# Patient Record
Sex: Female | Born: 1975 | Race: Black or African American | Hispanic: No | Marital: Single | Smoking: Never smoker
Health system: Southern US, Community
[De-identification: ages and names within clinical notes are randomized; demographics above are authoritative.]

## PROBLEM LIST (undated history)

## (undated) DIAGNOSIS — T7840XA Allergy, unspecified, initial encounter: Secondary | ICD-10-CM

## (undated) DIAGNOSIS — E785 Hyperlipidemia, unspecified: Secondary | ICD-10-CM

## (undated) DIAGNOSIS — F419 Anxiety disorder, unspecified: Secondary | ICD-10-CM

## (undated) DIAGNOSIS — I10 Essential (primary) hypertension: Secondary | ICD-10-CM

## (undated) DIAGNOSIS — D649 Anemia, unspecified: Secondary | ICD-10-CM

## (undated) HISTORY — DX: Allergy, unspecified, initial encounter: T78.40XA

## (undated) HISTORY — DX: Anemia, unspecified: D64.9

## (undated) HISTORY — DX: Hyperlipidemia, unspecified: E78.5

## (undated) HISTORY — DX: Anxiety disorder, unspecified: F41.9

## (undated) HISTORY — PX: ABDOMINAL HYSTERECTOMY: SHX81

## (undated) HISTORY — DX: Essential (primary) hypertension: I10

---

## 2009-04-11 ENCOUNTER — Emergency Department: Payer: Self-pay | Admitting: Emergency Medicine

## 2009-09-29 ENCOUNTER — Emergency Department: Payer: Self-pay | Admitting: Emergency Medicine

## 2011-01-13 ENCOUNTER — Emergency Department: Payer: Self-pay | Admitting: Emergency Medicine

## 2011-11-11 IMAGING — CR DG CHEST 1V PORT
1 series · 1 of 1 positions shown · non-contrast
Comparison: none

REASON FOR EXAM: Chest Pain
COMMENTS:

PROCEDURE:     DXR - DXR PORTABLE CHEST SINGLE VIEW  - September 29, 2009  [DATE]
RESULT:     The lung fields are clear. The heart, mediastinal and osseous
structures show no significant abnormalities.

[view not recorded]
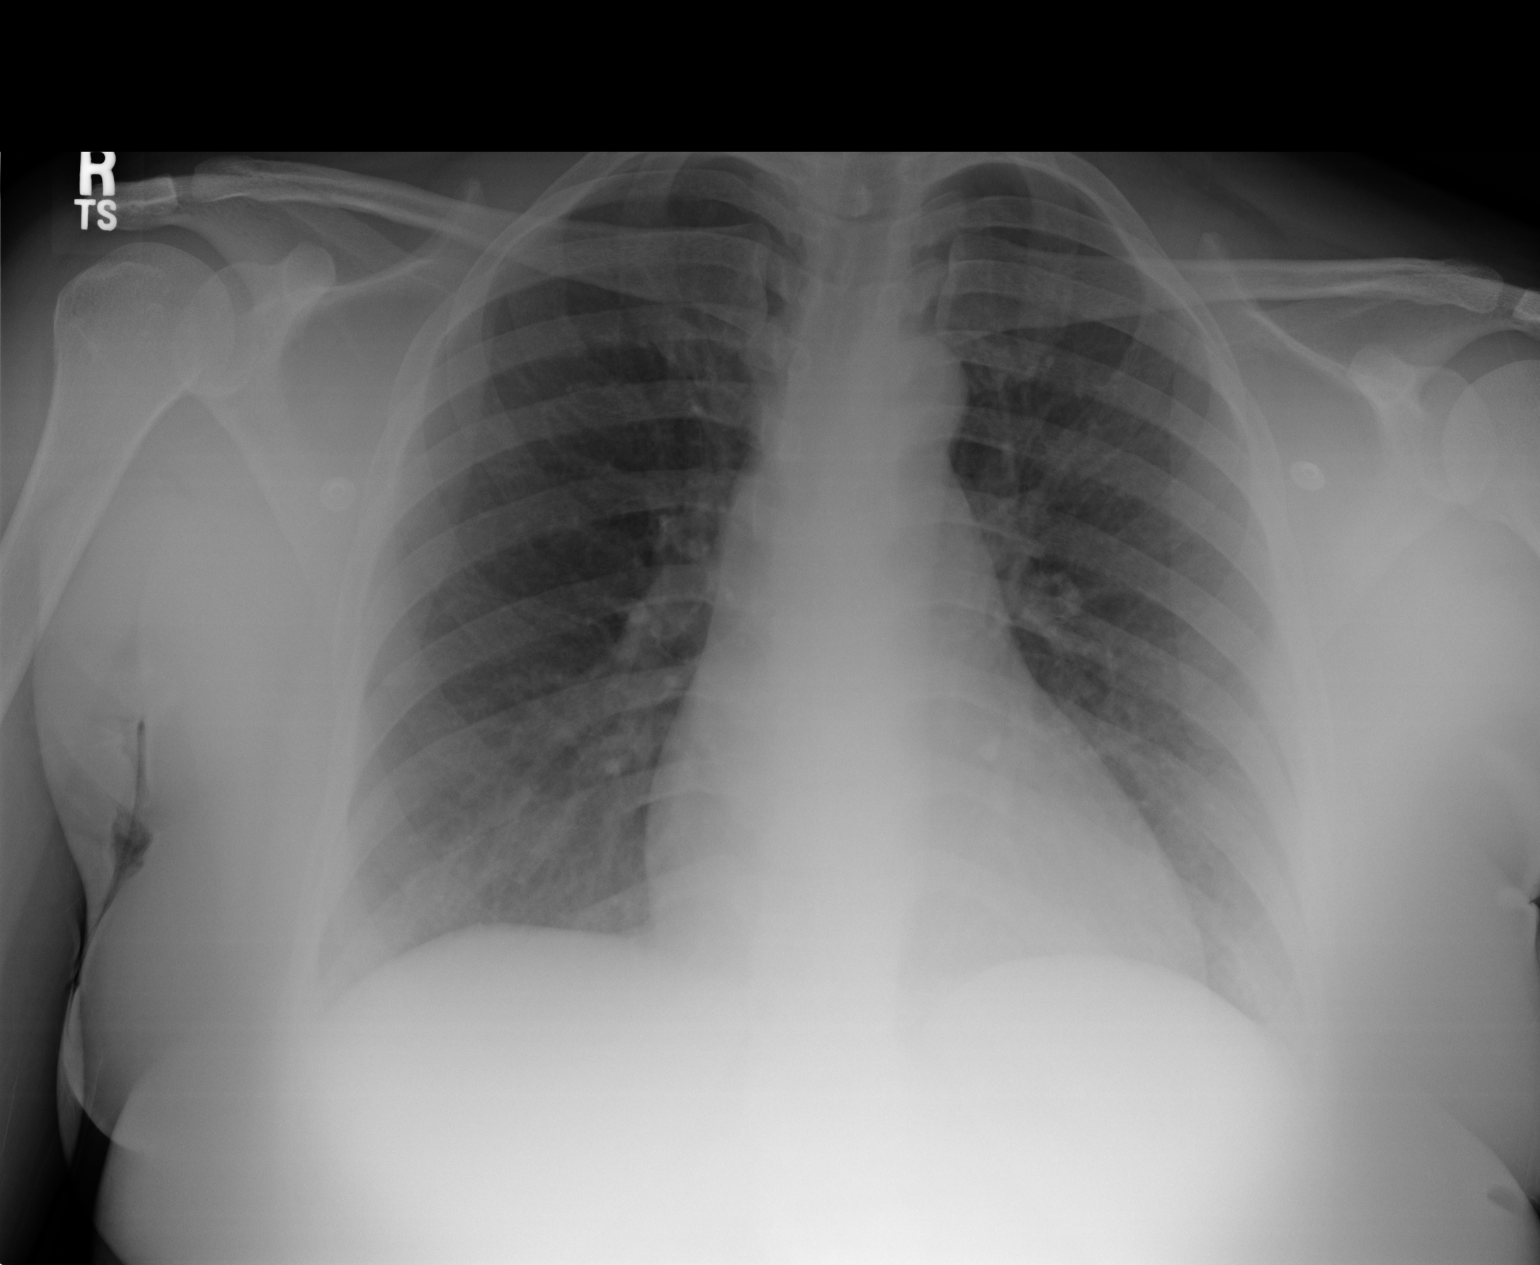

[1 of 1 positions shown; findings below may reference images not displayed]

IMPRESSION: No acute changes are identified.

## 2013-02-24 IMAGING — US ABDOMEN ULTRASOUND LIMITED
1 series · 17 of 25 positions shown · non-contrast
Comparison: none

REASON FOR EXAM: RUQ pain
COMMENTS:   Body Site: GB and Fossa, CBD, Head of Pancreas

PROCEDURE:     US  - US ABDOMEN LIMITED SURVEY  - January 13, 2011  [DATE]
RESULT:     Comparison: None.
TECHNIQUE: Multiple grayscale and color Doppler images were obtained of the
right upper quadrant.

[Series 1: abdomen ultrasound limited · 17 of 49 slices shown]
[im 1/49]
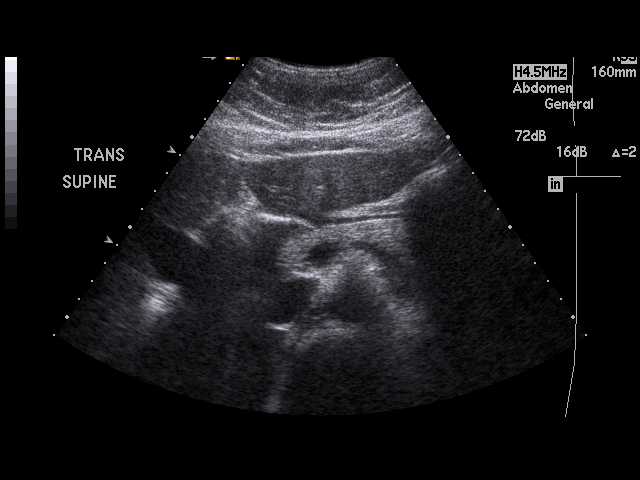
[im 5/49]
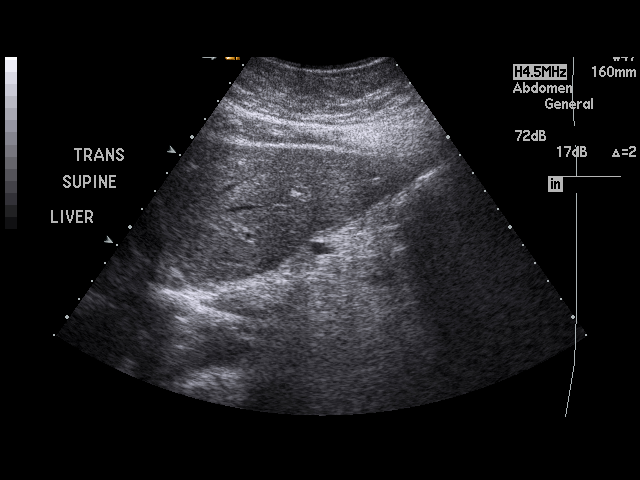
[im 7/49]
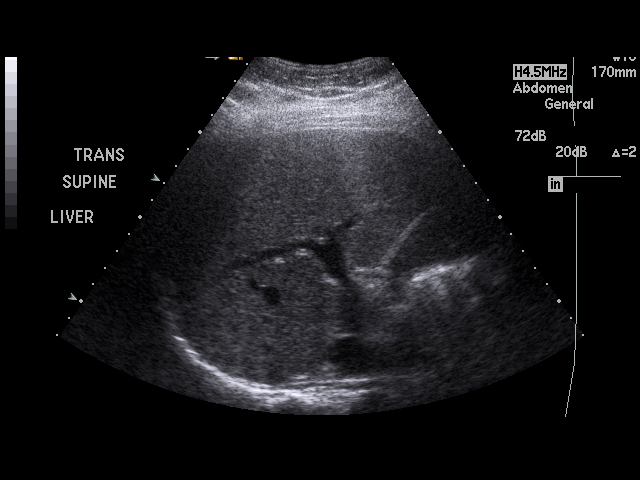
[im 11/49]
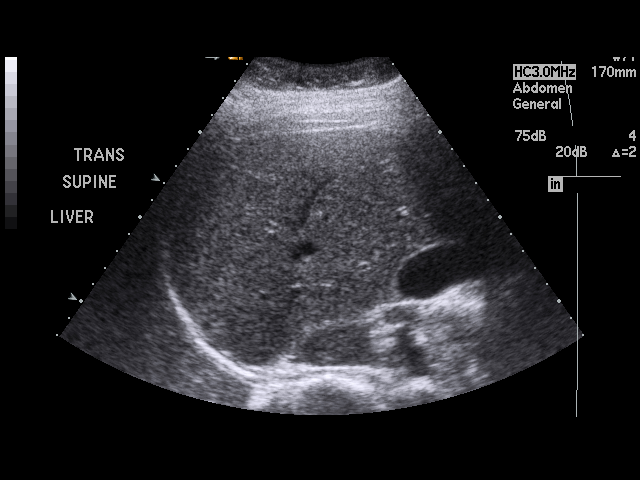
[im 13/49]
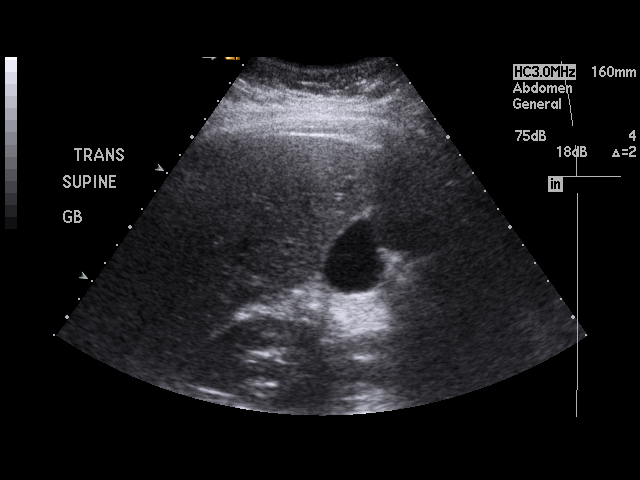
[im 17/49]
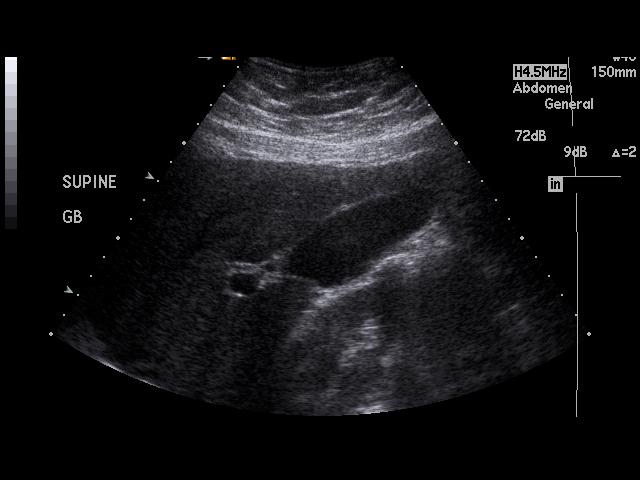
[im 19/49]
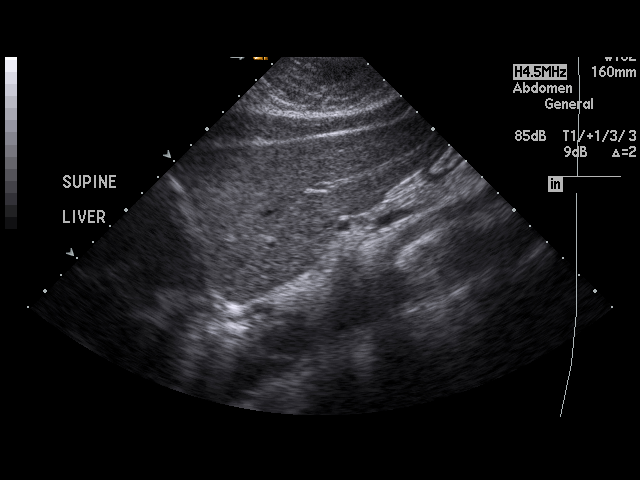
[im 23/49]
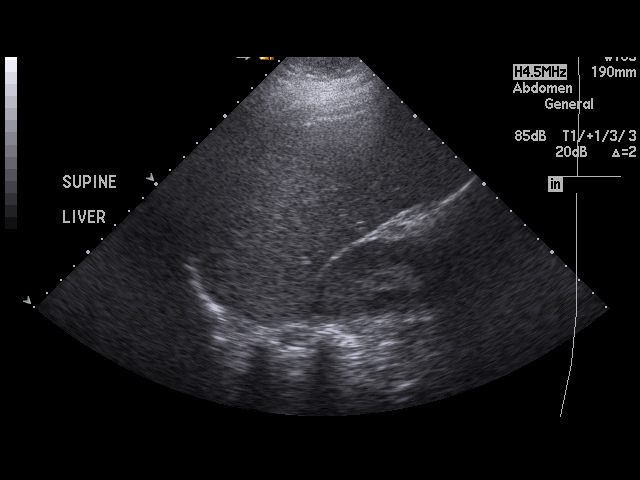
[im 25/49]
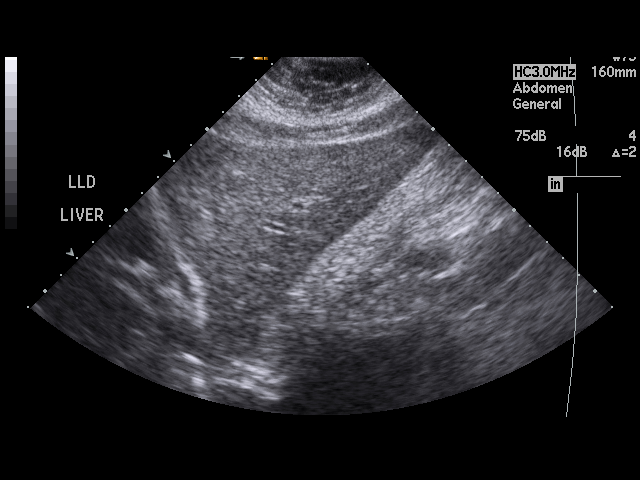
[im 27/49]
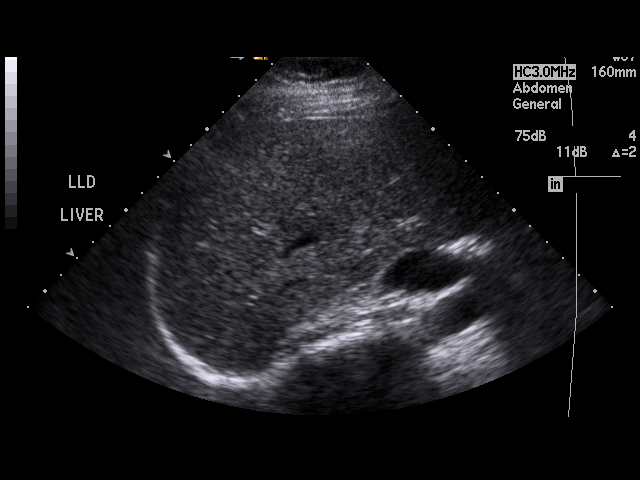
[im 31/49]
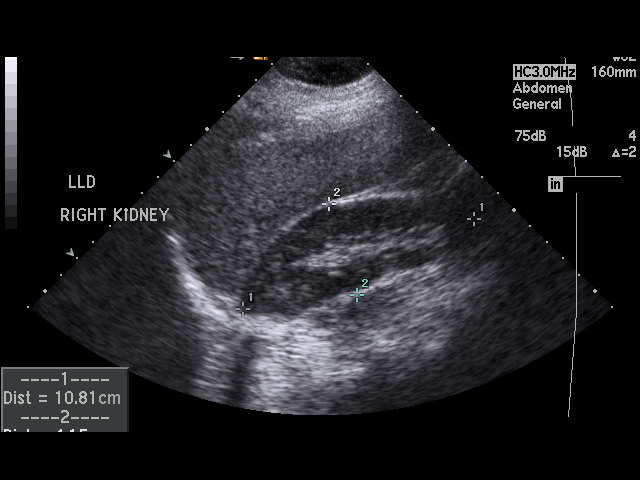
[im 33/49]
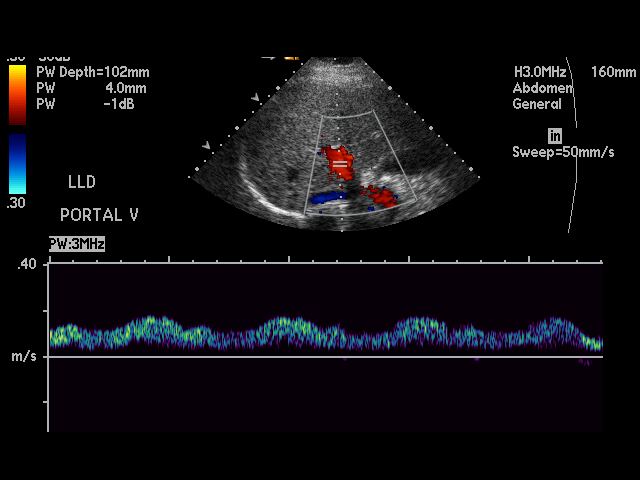
[im 37/49]
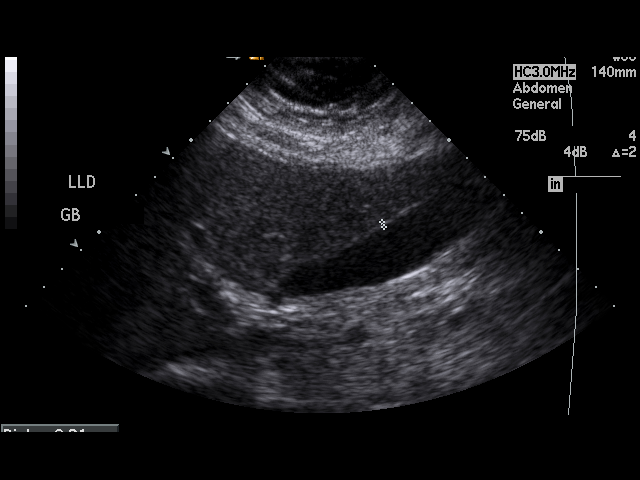
[im 39/49]
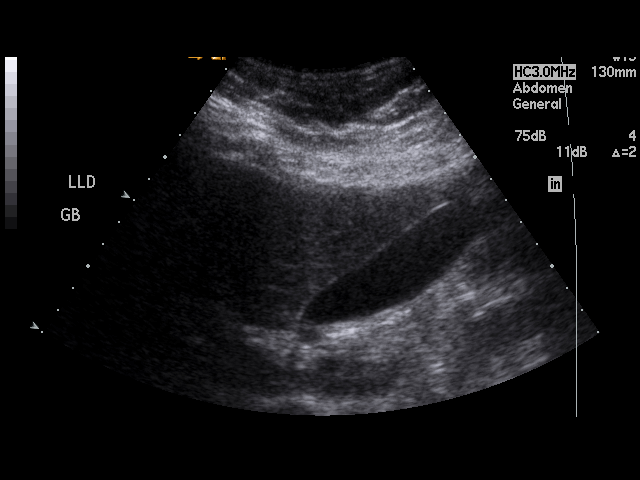
[im 43/49]
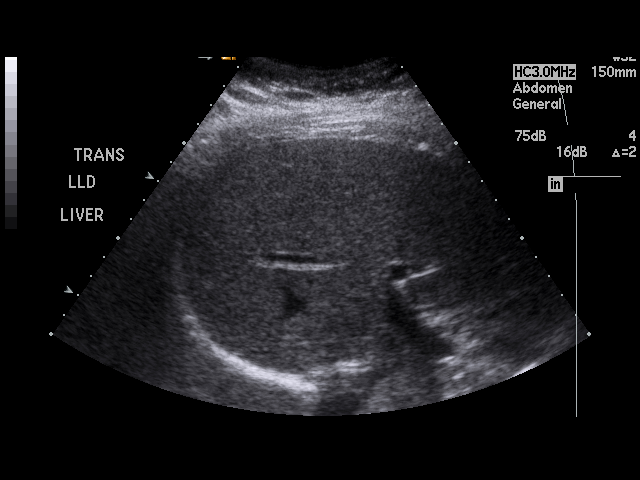
[im 45/49]
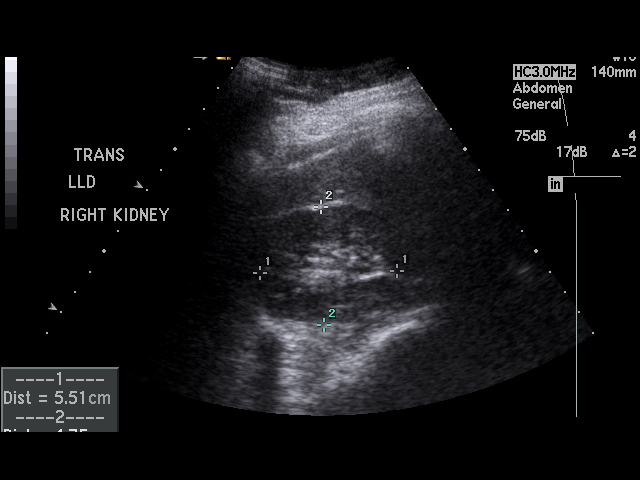
[im 49/49]
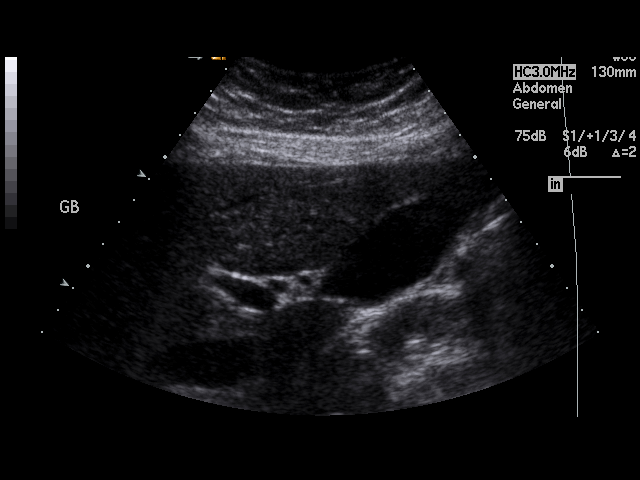

[17 of 25 positions shown; findings below may reference images not displayed]

FINDINGS: The gallbladder is normal. Sonographic Murphy sign was negative. The common
bile duct measures 3-4 mm. The visualized pancreas is unremarkable.
IMPRESSION: Normal gallbladder.

## 2013-02-24 IMAGING — CR DG CHEST 2V
1 series · 2 of 2 positions shown · non-contrast
Comparison: none

REASON FOR EXAM: cough fever
COMMENTS:

PROCEDURE:     DXR - DXR CHEST PA (OR AP) AND LATERAL  - January 13, 2011  [DATE]
RESULT:     The lungs are clear. The cardiac silhouette and visualized bony
skeleton are unremarkable.

[Series 1: w chest pa · 0.14mm/px · 2 of 2 slices shown]
[im 1/2]
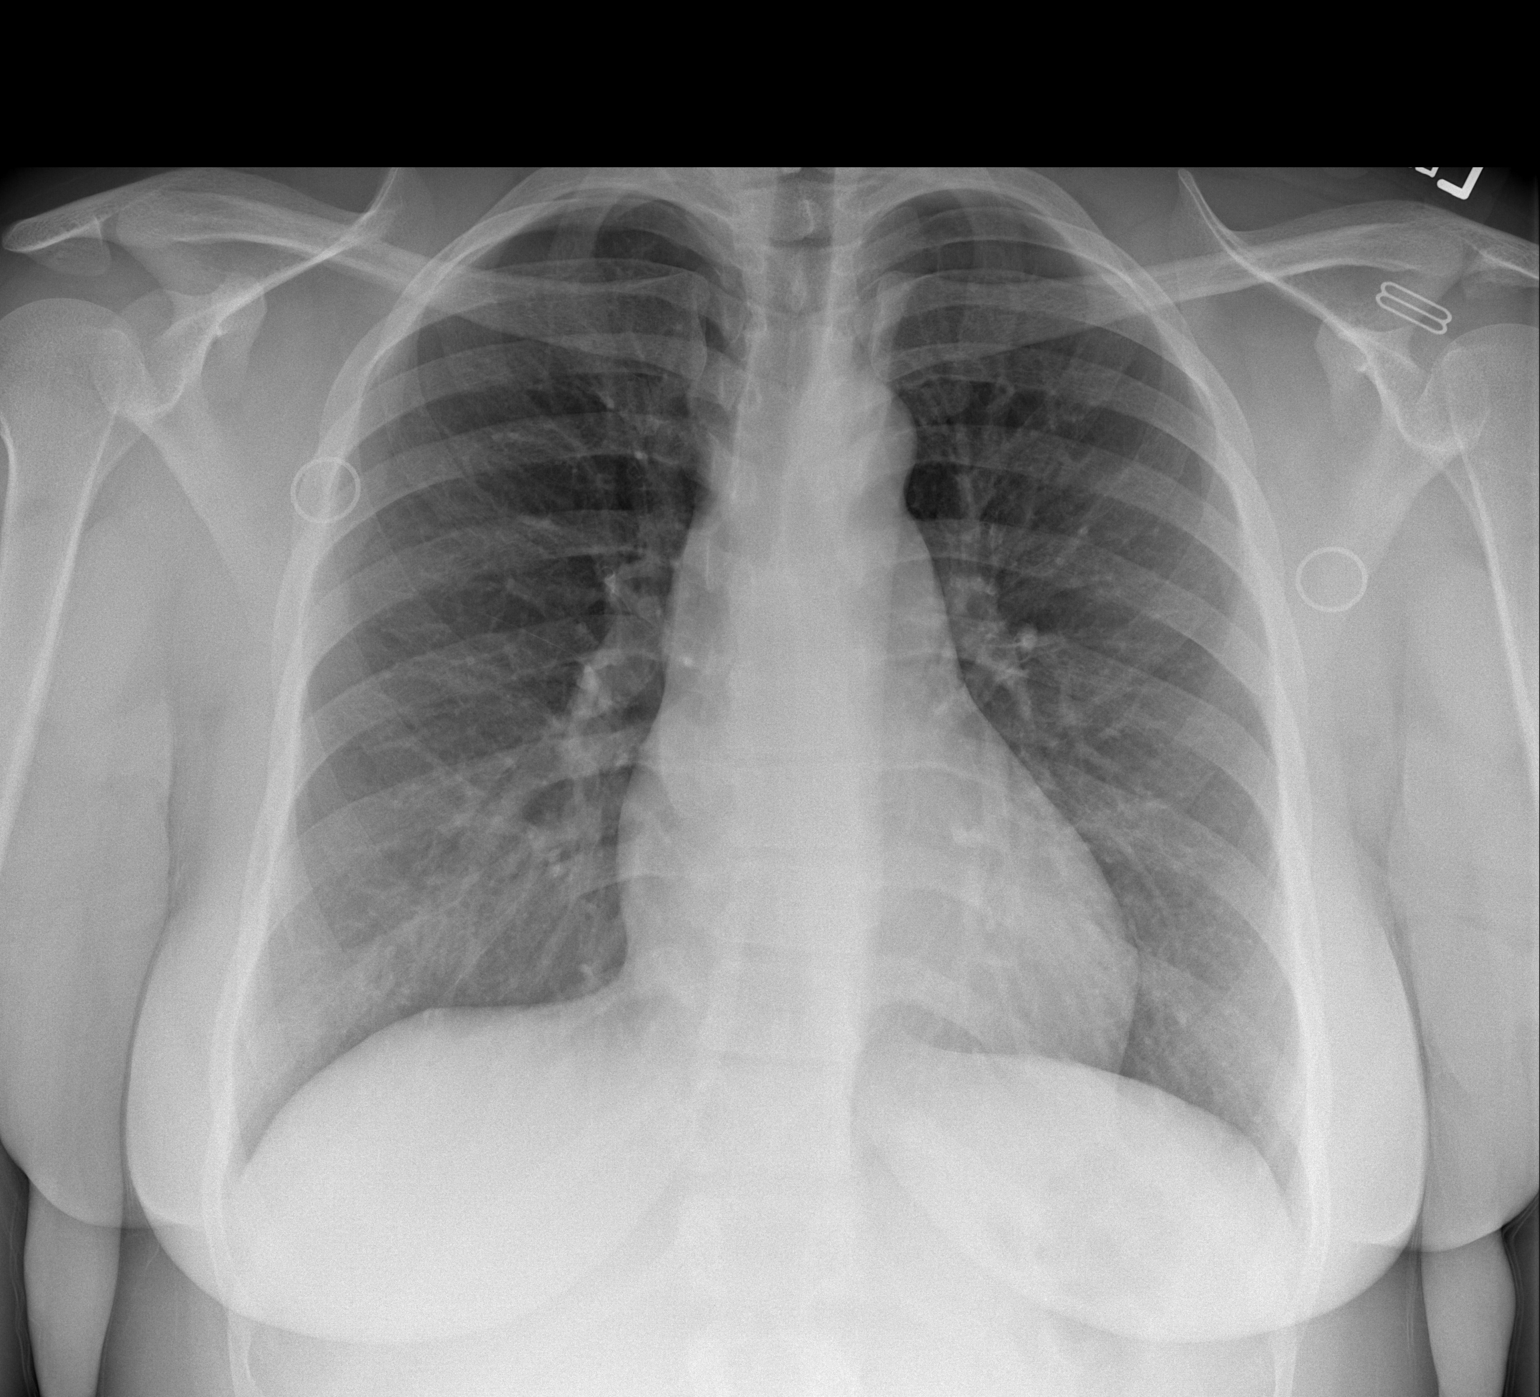
[im 2/2]
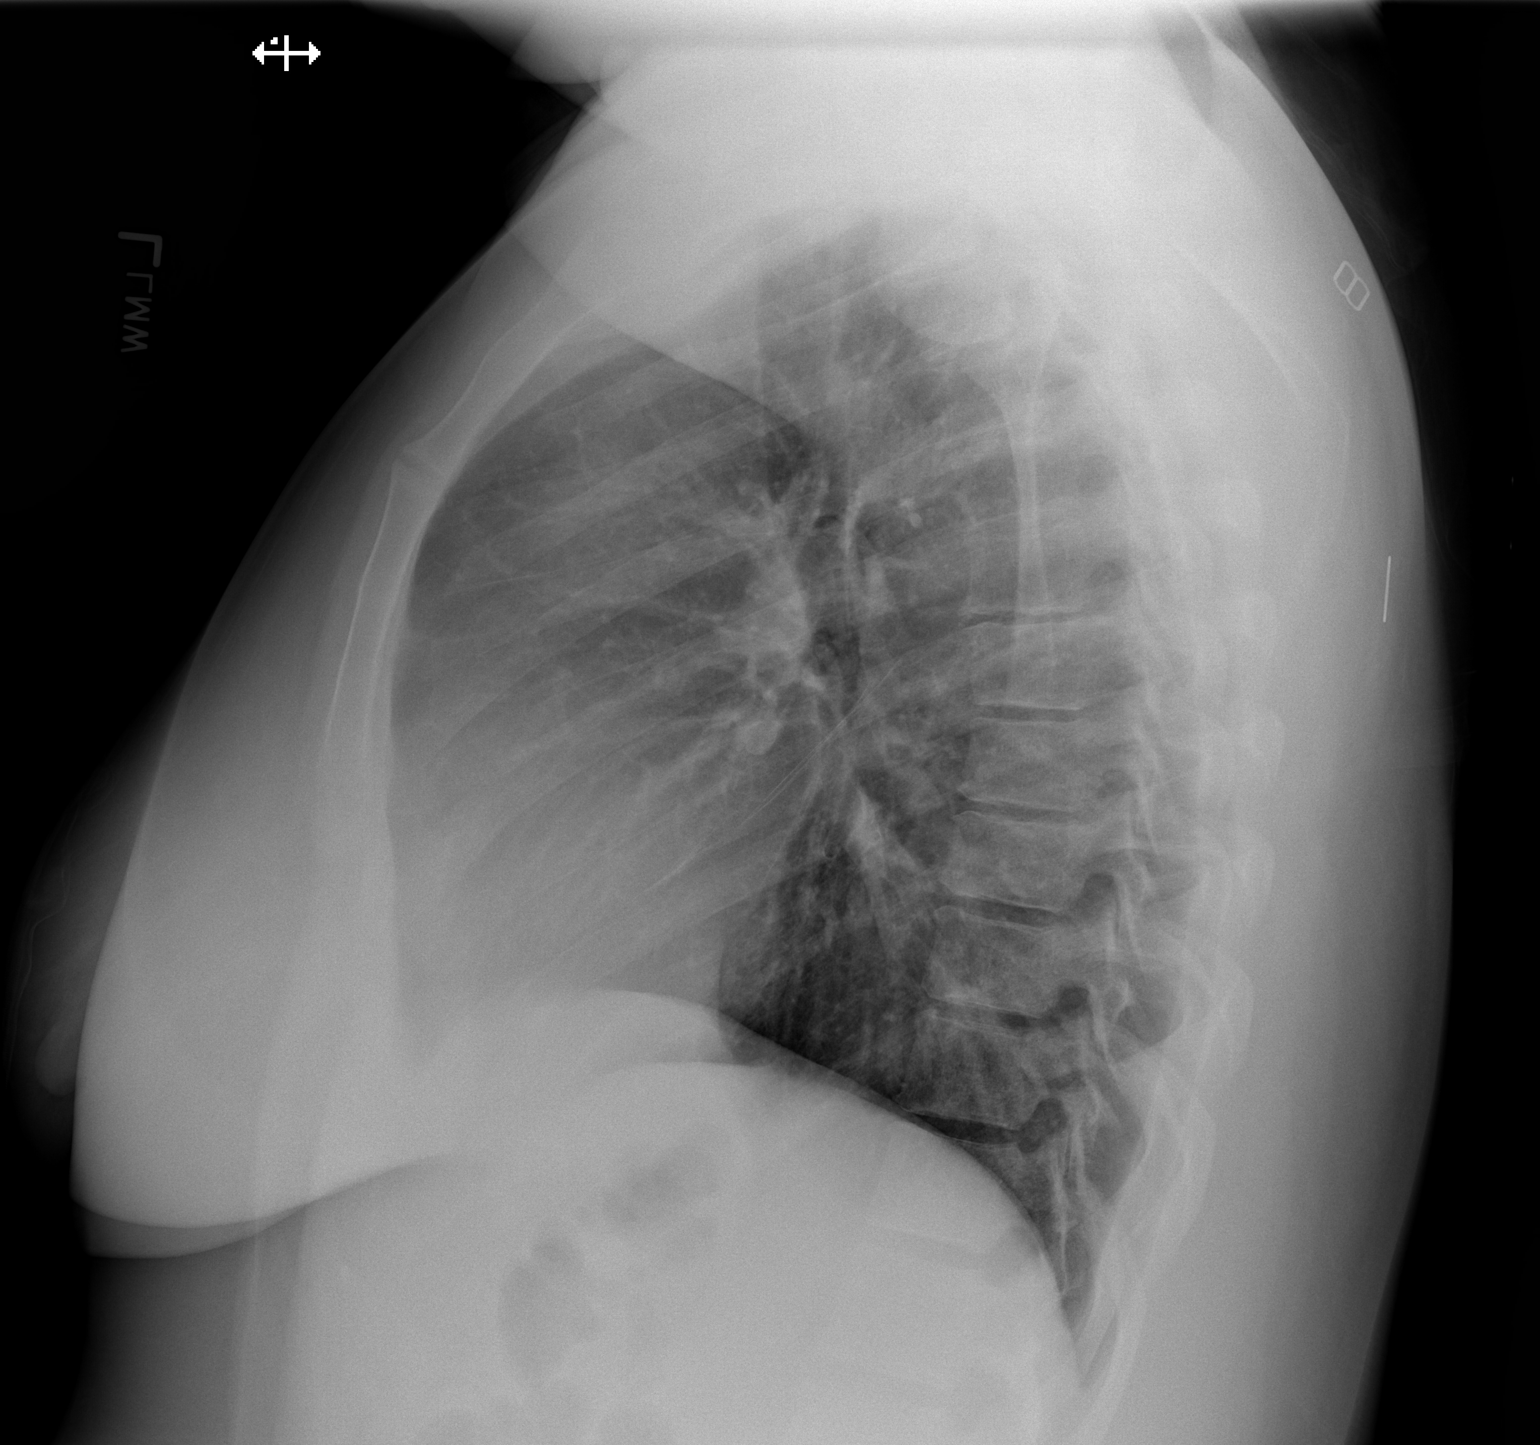

[2 of 2 positions shown; findings below may reference images not displayed]

IMPRESSION: 1. Chest radiograph without evidence of acute cardiopulmonary disease.
2. A comparison was made to prior study dated 09/30/2010.

## 2014-10-01 ENCOUNTER — Telehealth: Payer: Self-pay | Admitting: Family Medicine

## 2014-10-01 MED ORDER — ATENOLOL 50 MG PO TABS: 50.0000 mg | ORAL_TABLET | Freq: Every day | ORAL | Status: DC

## 2014-10-01 NOTE — Telephone Encounter (Signed)
Atenolol 50 mg has been refilled and sent to [facility]

## 2014-10-02 ENCOUNTER — Ambulatory Visit (INDEPENDENT_AMBULATORY_CARE_PROVIDER_SITE_OTHER): Payer: BLUE CROSS/BLUE SHIELD | Admitting: Family Medicine

## 2014-10-02 ENCOUNTER — Encounter: Payer: Self-pay | Admitting: Family Medicine

## 2014-10-02 VITALS — BP 140/78 | HR 69 | Temp 98.1°F | Resp 18 | Ht 72.0 in | Wt 285.2 lb

## 2014-10-02 DIAGNOSIS — I10 Essential (primary) hypertension: Secondary | ICD-10-CM | POA: Diagnosis not present

## 2014-10-02 DIAGNOSIS — D508 Other iron deficiency anemias: Secondary | ICD-10-CM

## 2014-10-02 DIAGNOSIS — M25511 Pain in right shoulder: Secondary | ICD-10-CM

## 2014-10-02 DIAGNOSIS — D649 Anemia, unspecified: Secondary | ICD-10-CM | POA: Insufficient documentation

## 2014-10-02 MED ORDER — VALSARTAN-HYDROCHLOROTHIAZIDE 160-12.5 MG PO TABS: 1.0000 | ORAL_TABLET | Freq: Every day | ORAL | Status: DC

## 2014-10-02 MED ORDER — ATENOLOL 50 MG PO TABS: 50.0000 mg | ORAL_TABLET | Freq: Every day | ORAL | Status: AC

## 2014-10-02 NOTE — Progress Notes (Signed)
Name: Cindy Maldonado   MRN: [id number]    DOB: 02/27/1975   Date:10/02/2014       Progress Note  Subjective  Chief Complaint  Chief Complaint  Patient presents with  . Medication Refill    Atenolol 50 mg / diovan HCT 160-12.5mg   . Hypertension  . Hyperlipidemia  . Anemia    Hypertension This is a chronic problem. The problem is unchanged. The problem is controlled. Associated symptoms include shortness of breath (at times, she has shortness of breath). Pertinent negatives include no chest pain, headaches, malaise/fatigue or palpitations. Past treatments include angiotensin blockers, diuretics and beta blockers.  Anemia Presents for follow-up visit. There has been no abdominal pain, anorexia, bruising/bleeding easily, fever, leg swelling, light-headedness, malaise/fatigue or palpitations. Signs of blood loss that are not present include hematemesis, hematochezia and menorrhagia (sometimes she passes clots during her menstrual cycle. Most times, she has regular menstrual cycle). Past treatments include nothing. There is no history of clotting disorder.  Shoulder Pain  The pain is present in the right shoulder. This is a new problem. The current episode started 1 to 4 weeks ago (3 weeks). The quality of the pain is described as aching. The pain is at a severity of 10/10. The pain is moderate. Associated symptoms include stiffness. Pertinent negatives include no fever, inability to bear weight, joint locking or numbness. The symptoms are aggravated by activity (raising the right arm). She has tried nothing for the symptoms.    Past Medical History  Diagnosis Date  . Allergy   . Anemia   . Hyperlipidemia   . Hypertension     Past Surgical History  Procedure Laterality Date  . Cesarean section      Family History  Problem Relation Age of Onset  . Hypertension Mother   . Diabetes Father   . Cancer Father     prostate  . Hypertension Sister   . Hypertension Brother     Social  History   Social History  . Marital Status: Single    Spouse Name: N/A  . Number of Children: N/A  . Years of Education: N/A   Occupational History  . Not on file.   Social History Main Topics  . Smoking status: Never Smoker   . Smokeless tobacco: Never Used  . Alcohol Use: 1.2 oz/week    2 Shots of liquor per week  . Drug Use: No  . Sexual Activity:    Partners: Male    Birth Control/ Protection: None   Other Topics Concern  . Not on file   Social History Narrative  . No narrative on file     Current outpatient prescriptions:  .  atenolol (TENORMIN) 50 MG tablet, Take 1 tablet (50 mg total) by mouth daily., Disp: 90 tablet, Rfl: 0 .  valsartan-hydrochlorothiazide (DIOVAN-HCT) 160-12.5 MG per tablet, Take 1 tablet by mouth daily., Disp: , Rfl:   No Known Allergies   Review of Systems  Constitutional: Negative for fever and malaise/fatigue.  Respiratory: Positive for shortness of breath (at times, she has shortness of breath).   Cardiovascular: Negative for chest pain and palpitations.  Gastrointestinal: Negative for abdominal pain, hematochezia, anorexia and hematemesis.  Genitourinary: Negative for menorrhagia (sometimes she passes clots during her menstrual cycle. Most times, she has regular menstrual cycle).  Musculoskeletal: Positive for stiffness.  Neurological: Negative for light-headedness, numbness and headaches.  Endo/Heme/Allergies: Does not bruise/bleed easily.   Objective  Filed Vitals:   10/02/14 0940  BP: 140/78  Pulse: 69  Temp: 98.1 F (36.7 C)  TempSrc: Oral  Resp: 18  Height: 6' (1.829 m)  Weight: 285 lb 3.2 oz (129.366 kg)  SpO2: 99%    Physical Exam  Constitutional: She is oriented to person, place, and time and well-developed, well-nourished, and in no distress.  Cardiovascular: Normal rate and regular rhythm.   Pulmonary/Chest: Effort normal and breath sounds normal.  Abdominal: Soft. Bowel sounds are normal.  Musculoskeletal:        Right shoulder: She exhibits decreased range of motion (pain when arm is extended) and pain. She exhibits no bony tenderness and no spasm.  Neurological: She is alert and oriented to person, place, and time.  Nursing note and vitals reviewed.   Assessment & Plan  1. Essential hypertension  - Comprehensive Metabolic Panel (CMET) - atenolol (TENORMIN) 50 MG tablet; Take 1 tablet (50 mg total) by mouth daily.  Dispense: 90 tablet; Refill: 1 - valsartan-hydrochlorothiazide (DIOVAN-HCT) 160-12.5 MG per tablet; Take 1 tablet by mouth daily.  Dispense: 90 tablet; Refill: 1  2. Other iron deficiency anemias  - CBC w/Diff/Platelet  3. Right shoulder pain  - DG Shoulder Right; Future   Theoplis Garciagarcia [facility] 10/02/2014 10:05 AM

## 2014-10-03 LAB — COMPREHENSIVE METABOLIC PANEL
ALK PHOS: 94 IU/L (ref 39–117)
ALT: 10 IU/L (ref 0–32)
AST: 17 IU/L (ref 0–40)
Albumin/Globulin Ratio: 1.3 (ref 1.1–2.5)
Albumin: 4.2 g/dL (ref 3.5–5.5)
BUN/Creatinine Ratio: 13 (ref 8–20)
BUN: 9 mg/dL (ref 6–20)
Bilirubin Total: 0.2 mg/dL (ref 0.0–1.2)
CO2: 25 mmol/L (ref 18–29)
CREATININE: 0.68 mg/dL (ref 0.57–1.00)
Calcium: 9.6 mg/dL (ref 8.7–10.2)
Chloride: 101 mmol/L (ref 97–108)
GFR calc Af Amer: 130 mL/min/{1.73_m2} (ref >59)
GFR calc non Af Amer: 113 mL/min/{1.73_m2} (ref >59)
GLUCOSE: 80 mg/dL (ref 65–99)
Globulin, Total: 3.3 g/dL (ref 1.5–4.5)
Potassium: 4.3 mmol/L (ref 3.5–5.2)
Sodium: 140 mmol/L (ref 134–144)
Total Protein: 7.5 g/dL (ref 6.0–8.5)

## 2014-10-03 LAB — CBC WITH DIFFERENTIAL/PLATELET
BASOS ABS: 0 10*3/uL (ref 0.0–0.2)
Basos: 0 %
EOS (ABSOLUTE): 0.2 10*3/uL (ref 0.0–0.4)
Eos: 3 %
Hematocrit: 29 % — ABNORMAL LOW (ref 34.0–46.6)
Hemoglobin: 9 g/dL — ABNORMAL LOW (ref 11.1–15.9)
IMMATURE GRANULOCYTES: 0 %
Immature Grans (Abs): 0 10*3/uL (ref 0.0–0.1)
Lymphocytes Absolute: 2 10*3/uL (ref 0.7–3.1)
Lymphs: 33 %
MCH: 22.8 pg — ABNORMAL LOW (ref 26.6–33.0)
MCHC: 31 g/dL — ABNORMAL LOW (ref 31.5–35.7)
MCV: 73 fL — ABNORMAL LOW (ref 79–97)
MONOS ABS: 0.5 10*3/uL (ref 0.1–0.9)
Monocytes: 8 %
NEUTROS PCT: 56 %
Neutrophils Absolute: 3.4 10*3/uL (ref 1.4–7.0)
Platelets: 293 10*3/uL (ref 150–379)
RBC: 3.95 x10E6/uL (ref 3.77–5.28)
RDW: 16.1 % — AB (ref 12.3–15.4)
WBC: 6.1 10*3/uL (ref 3.4–10.8)

## 2015-03-27 ENCOUNTER — Other Ambulatory Visit: Payer: Self-pay | Admitting: Family Medicine

## 2015-04-01 ENCOUNTER — Ambulatory Visit: Payer: Managed Care, Other (non HMO) | Admitting: Family Medicine

## 2015-04-14 ENCOUNTER — Ambulatory Visit: Payer: Managed Care, Other (non HMO) | Admitting: Family Medicine

## 2018-07-10 ENCOUNTER — Other Ambulatory Visit: Payer: Self-pay

## 2018-07-10 ENCOUNTER — Other Ambulatory Visit (HOSPITAL_COMMUNITY)
Admission: RE | Admit: 2018-07-10 | Discharge: 2018-07-10 | Disposition: A | Discharge status: Home / Self Care | Payer: BC Managed Care – PPO | Source: Ambulatory Visit | Attending: Obstetrics & Gynecology | Admitting: Obstetrics & Gynecology

## 2018-07-10 ENCOUNTER — Ambulatory Visit (INDEPENDENT_AMBULATORY_CARE_PROVIDER_SITE_OTHER): Payer: BC Managed Care – PPO | Admitting: Obstetrics & Gynecology

## 2018-07-10 ENCOUNTER — Encounter: Payer: Self-pay | Admitting: Obstetrics & Gynecology

## 2018-07-10 VITALS — BP 130/90 | Ht 71.0 in | Wt 320.0 lb

## 2018-07-10 DIAGNOSIS — Z124 Encounter for screening for malignant neoplasm of cervix: Secondary | ICD-10-CM | POA: Insufficient documentation

## 2018-07-10 DIAGNOSIS — Z01419 Encounter for gynecological examination (general) (routine) without abnormal findings: Secondary | ICD-10-CM

## 2018-07-10 DIAGNOSIS — Z1239 Encounter for other screening for malignant neoplasm of breast: Secondary | ICD-10-CM

## 2018-07-10 DIAGNOSIS — D219 Benign neoplasm of connective and other soft tissue, unspecified: Secondary | ICD-10-CM

## 2018-07-10 NOTE — Progress Notes (Signed)
HPI:      [name] is a 43 y.o. [phone] who LMP was Patient's last menstrual period was 07/01/2018., she presents today for her annual examination. The patient has worsening periods related to fibroids, feels they have grown, and has pressure and bladder frequency as well. Periods are monthly with heavy flow using largest pads and having accidents frequently.  Does not desire pregnancy, s/p BTL.  The patient is sexually active. Her last pap: approximate date 2017 and was normal and last mammogram: approximate date 2015 and was normal. The patient does perform self breast exams.  There is no notable family history of breast or ovarian cancer in her family.  The patient has regular exercise: yes.  The patient denies current symptoms of depression.    GYN History: Contraception: tubal ligation  PMHx: Past Medical History:  Diagnosis Date  . Allergy   . Anemia   . Anxiety   . Hyperlipidemia   . Hypertension    Past Surgical History:  Procedure Laterality Date  . CESAREAN SECTION     Family History  Problem Relation Age of Onset  . Hypertension Mother   . Diabetes Father   . Cancer Father        prostate  . Hypertension Sister   . Hypertension Brother    Social History   Tobacco Use  . Smoking status: Never Smoker  . Smokeless tobacco: Never Used  Substance Use Topics  . Alcohol use: Yes    Alcohol/week: 2.0 standard drinks    Types: 2 Shots of liquor per week  . Drug use: No    Current Outpatient Medications:  .  escitalopram (LEXAPRO) 5 MG tablet, Take by mouth., Disp: , Rfl:  .  valsartan-hydrochlorothiazide (DIOVAN-HCT) 160-12.5 MG tablet, TAKE ONE TABLET BY MOUTH ONCE DAILY, Disp: 90 tablet, Rfl: 0 .  atenolol (TENORMIN) 50 MG tablet, Take 1 tablet (50 mg total) by mouth daily., Disp: 90 tablet, Rfl: 1 Allergies: Patient has no known allergies.  Review of Systems  Constitutional: Positive for malaise/fatigue. Negative for chills and fever.  HENT:  Negative for congestion, sinus pain and sore throat.   Eyes: Negative for blurred vision and pain.  Respiratory: Negative for cough and wheezing.   Cardiovascular: Negative for chest pain and leg swelling.  Gastrointestinal: Negative for abdominal pain, constipation, diarrhea, heartburn, nausea and vomiting.  Genitourinary: Negative for dysuria, frequency, hematuria and urgency.  Musculoskeletal: Negative for back pain, joint pain, myalgias and neck pain.  Skin: Negative for itching and rash.  Neurological: Negative for dizziness, tremors and weakness.  Endo/Heme/Allergies: Does not bruise/bleed easily.  Psychiatric/Behavioral: Negative for depression. The patient is not nervous/anxious and does not have insomnia.     Objective: BP 130/90   Ht 5\' 11"  (1.803 m)   Wt (!) 320 lb (145.2 kg)   LMP 07/01/2018   BMI 44.63 kg/m   Filed Weights   07/10/18 1527  Weight: (!) 320 lb (145.2 kg)   Body mass index is 44.63 kg/m. Physical Exam Constitutional:      General: She is not in acute distress.    Appearance: She is well-developed.  Genitourinary:     Pelvic exam was performed with patient supine.     Vagina, uterus and rectum normal.     No lesions in the vagina.     No vaginal bleeding.     No cervical motion tenderness, friability, lesion or polyp.     Uterus is mobile.  Uterus is not enlarged.     No uterine mass detected.    Uterus is midaxial.     No right or left adnexal mass present.     Right adnexa not tender.     Left adnexa not tender.     Genitourinary Comments: Midline globular fibroid uterus 16 weeks size  HENT:     Head: Normocephalic and atraumatic. No laceration.     Right Ear: Hearing normal.     Left Ear: Hearing normal.     Mouth/Throat:     Pharynx: Uvula midline.  Eyes:     Pupils: Pupils are equal, round, and reactive to light.  Neck:     Musculoskeletal: Normal range of motion and neck supple.     Thyroid: No thyromegaly.  Cardiovascular:      Rate and Rhythm: Normal rate and regular rhythm.     Heart sounds: No murmur. No friction rub. No gallop.   Pulmonary:     Effort: Pulmonary effort is normal. No respiratory distress.     Breath sounds: Normal breath sounds. No wheezing.  Chest:     Breasts:        Right: No mass, skin change or tenderness.        Left: No mass, skin change or tenderness.  Abdominal:     General: Bowel sounds are normal. There is no distension.     Palpations: Abdomen is soft.     Tenderness: There is no abdominal tenderness. There is no rebound.     Comments: Umb -3 cm fibroid uterus  Musculoskeletal: Normal range of motion.  Neurological:     Mental Status: She is alert and oriented to person, place, and time.     Cranial Nerves: No cranial nerve deficit.  Skin:    General: Skin is warm and dry.  Psychiatric:        Judgment: Judgment normal.  Vitals signs reviewed.     Assessment:  ANNUAL EXAM 1. Women's annual routine gynecological examination   2. Screening for cervical cancer   3. Screening for breast cancer   4. Fibroids     Screening Plan:            1.  Cervical Screening-  Pap smear done today  2. Breast screening- Exam annually and mammogram>40 planned   3. Colonoscopy every 10 years, Hemoccult testing - after age 41  4. Labs managed by PCP  5. Counseling for contraception: bilateral tubal ligation   6. Fibroids - Fibroid treatment such as [place], Lupron, Myomectomy, and Hysterectomy discussed in detail, with the pros and cons of each choice counseled.  No treatment as an option also discussed, as well as control of symptoms alone with hormone therapy. Information provided to the patient. - Past counseling and surgery planning was for [facility], but has fallen thru in past due to insurance and job changes.  Still prefers this approach.  Fibroids are large, not amenable to laparscopy. - US PELVIC COMPLETE WITH TRANSVAGINAL; Future      F/U  Return in about 1 day (around 07/11/2018)  for Follow up w GYN [place].  Barnett Applebaum, MD, [facility], [facility] 07/10/2018  4:06 PM

## 2018-07-10 NOTE — Patient Instructions (Signed)
PAP every three years Mammogram every year    Call [phone] to schedule at [facility]    Hysterectomy Information  A hysterectomy is a surgery in which the uterus is removed. The fallopian tubes and ovaries may be removed (bilateral salpingo-oophorectomy) as well. This procedure may be done to treat various medical problems. After the procedure, a woman will no longer have menstrual periods nor will she be able to become pregnant (sterile). What are the reasons for a hysterectomy? There are many reasons why a woman might have this procedure. They include:  Persistent, abnormal vaginal bleeding.  Long-term (chronic) pelvic pain or infection.  Endometriosis. This is when the lining of the uterus (endometrium) starts to grow outside the uterus.  Adenomyosis. This is when the endometrium starts to grow in the muscle of the uterus.  Pelvic organ prolapse. This is a condition in which the uterus falls down into the vagina.  Noncancerous growths in the uterus (uterine fibroids) that cause symptoms.  The presence of precancerous cells.  Cervical or uterine cancer. What are the different types of hysterectomy? There are three different types of hysterectomy:  Supracervical hysterectomy. In this type, the top part of the uterus is removed, but not the cervix.  Total hysterectomy. In this type, the uterus and cervix are removed.  Radical hysterectomy. In this type, the uterus, the cervix, and the tissue that holds the uterus in place (parametrium) are removed. What are the different ways a hysterectomy can be performed? There are many different ways a hysterectomy can be performed, including:  Abdominal hysterectomy. In this type, an incision is made in the abdomen. The uterus is removed through this incision.  Vaginal hysterectomy. In this type, an incision is made in the vagina. The uterus is removed through this incision. There are no abdominal incisions.  Conventional  laparoscopic hysterectomy. In this type, three or four small incisions are made in the abdomen. A thin, lighted tube with a camera (laparoscope) is inserted into one of the incisions. Other tools are put through the other incisions. The uterus is cut into small pieces. The small pieces are removed through the incisions or through the vagina.  Laparoscopically assisted vaginal hysterectomy (LAVH). In this type, three or four small incisions are made in the abdomen. Part of the surgery is performed laparoscopically and the other part is done vaginally. The uterus is removed through the vagina.  Robot-assisted laparoscopic hysterectomy. In this type, a laparoscope and other tools are inserted into three or four small incisions in the abdomen. A computer-controlled device is used to give the surgeon a 3D image and to help control the surgical instruments. This allows for more precise movements of surgical instruments. The uterus is cut into small pieces and removed through the incisions or removed through the vagina. Discuss the options with your health care provider to determine which type is the right one for you. What are the risks? Generally, this is a safe procedure. However, problems may occur, including:  Bleeding and risk of blood transfusion. Tell your health care provider if you do not want to receive any blood products.  Blood clots in the legs or lung.  Infection.  Damage to other structures or organs.  Allergic reactions to medicines.  Changing to an abdominal hysterectomy from one of the other techniques. What to expect after a hysterectomy  You will be given pain medicine.  You may need to stay in the hospital for 1- 2 days to recover, depending on the type  of hysterectomy you had.  Follow your health care provider's instructions about exercise, driving, and general activities. Ask your health care provider what activities are safe for you.  You will need to have someone with  you for the first 3-5 days after you go home.  You will need to follow up with your surgeon in 2-4 weeks after surgery to evaluate your progress.  If the ovaries are removed, you will have early menopause symptoms such as hot flashes, night sweats, and insomnia.  If you had a hysterectomy for a problem that was not cancer or not a condition that could lead to cancer, then you no longer need Pap tests. However, even if you no longer need a Pap test, a regular pelvic exam is a good idea to make sure no other problems are developing. Questions to ask your health care provider  Is a hysterectomy medically necessary? Do I have other treatment options for my condition?  What are my options for hysterectomy procedure?  What organs and tissues need to be removed?  What are the risks?  What are the benefits?  How long will I need to stay in the hospital after the procedure?  How long will I need to recover at home?  What symptoms can I expect after the procedure? Summary  A hysterectomy is a surgery in which the uterus is removed. The fallopian tubes and ovaries may be removed (bilateral salpingo-oophorectomy) as well.  This procedure may be done to treat various medical problems. After the procedure, a woman will no longer have menstrual periods nor will she be able to become pregnant.  Discuss the options with your health care provider to determine which type of hysterectomy is the right one for you. This information is not intended to replace advice given to you by your health care provider. Make sure you discuss any questions you have with your health care provider. Document Released: 07/14/2000 Document Revised: 02/25/2016 Document Reviewed: 02/25/2016 Elsevier Interactive Patient Education  2019 [employer].

## 2018-07-12 LAB — CYTOLOGY - PAP
Adequacy: ABSENT
Diagnosis: NEGATIVE

## 2018-07-21 ENCOUNTER — Other Ambulatory Visit: Payer: Self-pay

## 2018-08-07 ENCOUNTER — Other Ambulatory Visit: Payer: Self-pay

## 2018-08-07 ENCOUNTER — Ambulatory Visit (INDEPENDENT_AMBULATORY_CARE_PROVIDER_SITE_OTHER): Payer: BC Managed Care – PPO

## 2018-08-07 ENCOUNTER — Encounter: Payer: Self-pay | Admitting: Obstetrics & Gynecology

## 2018-08-07 ENCOUNTER — Other Ambulatory Visit: Payer: Self-pay | Admitting: Obstetrics & Gynecology

## 2018-08-07 ENCOUNTER — Ambulatory Visit (INDEPENDENT_AMBULATORY_CARE_PROVIDER_SITE_OTHER): Payer: BC Managed Care – PPO | Admitting: Obstetrics & Gynecology

## 2018-08-07 VITALS — BP 140/90 | Ht 71.0 in | Wt 318.0 lb

## 2018-08-07 DIAGNOSIS — D250 Submucous leiomyoma of uterus: Secondary | ICD-10-CM

## 2018-08-07 DIAGNOSIS — D252 Subserosal leiomyoma of uterus: Secondary | ICD-10-CM

## 2018-08-07 DIAGNOSIS — D219 Benign neoplasm of connective and other soft tissue, unspecified: Secondary | ICD-10-CM

## 2018-08-07 DIAGNOSIS — R102 Pelvic and perineal pain unspecified side: Secondary | ICD-10-CM

## 2018-08-07 DIAGNOSIS — N946 Dysmenorrhea, unspecified: Secondary | ICD-10-CM

## 2018-08-07 NOTE — Patient Instructions (Signed)
Abdominal Hysterectomy Abdominal hysterectomy is a surgical procedure to remove the womb (uterus). The uterus is the muscular organ that houses a developing baby. This surgery may be done if:  You have cancer.  You have growths (tumors or fibroids) in the uterus.  You have long-term (chronic) pain.  You are bleeding.  Your uterus has slipped down into your vagina (uterine prolapse).  You have a condition in which the tissue that lines the uterus grows outside of its normal location (endometriosis).  You have an infection in your uterus.  You are having problems with your menstrual cycle. Depending on why you are having this procedure, you may also have other reproductive organs removed. These could include:  The part of your vagina that connects with your uterus (cervix).  The organs that make eggs (ovaries).  The tubes that connect the ovaries to the uterus (fallopian tubes). Tell a health care provider about:  Any allergies you have.  All medicines you are taking, including vitamins, herbs, eye drops, creams, and over-the-counter medicines.  Any problems you or family members have had with anesthetic medicines.  Any blood disorders you have.  Any surgeries you have had.  Any medical conditions you have.  Whether you are pregnant or may be pregnant. What are the risks? Generally, this is a safe procedure. However, problems may occur, including:  Bleeding.  Infection.  Allergic reactions to medicines or dyes.  Damage to other structures or organs.  Nerve injury.  Decreased interest in sex or pain during sex.  Blood clots that can break free and travel to your lungs. What happens before the procedure? Staying hydrated Follow instructions from your health care provider about hydration, which may include:  Up to 2 hours before the procedure - you may continue to drink clear liquids, such as water, clear fruit juice, black coffee, and plain tea Eating and  drinking restrictions Follow instructions from your health care provider about eating and drinking, which may include:  8 hours before the procedure - stop eating heavy meals or foods such as meat, fried foods, or fatty foods.  6 hours before the procedure - stop eating light meals or foods, such as toast or cereal.  6 hours before the procedure - stop drinking milk or drinks that contain milk.  2 hours before the procedure - stop drinking clear liquids. Medicines  Ask your health care provider about: ? Changing or stopping your regular medicines. This is especially important if you are taking diabetes medicines or blood thinners. ? Taking medicines such as aspirin and ibuprofen. These medicines can thin your blood. Do not take these medicines before your procedure if your health care provider instructs you not to.  You may be given antibiotic medicine to help prevent infection. Take it as told by your health care provider.  You may be asked to take laxatives to prevent constipation. General instructions  Ask your health care provider how your surgical site will be marked or identified.  You may be asked to shower with a germ-killing soap.  Plan to have someone take you home from the hospital.  Do not use any products that contain nicotine or tobacco, such as cigarettes and e-cigarettes. If you need help quitting, ask your health care provider.  You may have an exam or testing.  You may have a blood or urine sample taken.  You may need to have an enema to clean out your rectum and lower colon.  This procedure can affect the way   you feel about yourself. Talk to your health care provider about the physical and emotional changes this procedure may cause. What happens during the procedure?  To lower your risk of infection: ? Your health care team will wash or sanitize their hands. ? Your skin will be washed with soap. ? Hair may be removed from the surgical area.  An IV tube  will be inserted into one of your veins.  You will be given one or more of the following: ? A medicine to help you relax (sedative). ? A medicine to make you fall asleep (general anesthetic).  Tight-fitting (compression) stockings will be placed on your legs to promote circulation.  A thin, flexible tube (catheter) will be inserted to help drain your urine.  The surgeon will make a cut (incision) through the skin in your lower belly. The incision may go side-to-side or up-and-down.  The surgeon will move aside the body tissue that covers your uterus. The surgeon will then carefully take out your uterus along with any of the other organs that need to be removed.  Bleeding will be controlled with clamps or sutures.  The surgeon will close your incision with stitches (sutures), skin glue, or adhesive strips.  A bandage (dressing) will be placed over the incision. The procedure may vary among health care providers and hospitals. What happens after the procedure?  You will be given pain medicine as needed.  Your blood pressure, heart rate, breathing rate, and blood oxygen level will be monitored until the medicines you were given have worn off.  You will need to stay in the hospital to recover for one to two days. Ask your health care provider how long you will need to stay in the hospital after your procedure.  You may have a liquid diet at first. You will most likely return to your usual diet the day after surgery.  You will still have the urinary catheter in place. It will likely be removed the day after surgery.  You may have to wear compression stockings. These stockings help to prevent blood clots and reduce swelling in your legs.  You will be encouraged to walk as soon as possible. You will also use a device or do breathing exercises to keep your lungs clear.  You may need to use a sanitary napkin for vaginal discharge. Summary  Abdominal hysterectomy is a surgical procedure  to remove the womb (uterus). The uterus is the muscular organ that houses a developing baby.  This procedure can affect the way you feel about yourself. Talk to your health care provider about the physical and emotional changes this procedure may cause.  You will be given medicines for pain after the procedure.  You will need to stay in the hospital to recover. Ask your health care provider how long you will need to stay in the hospital after your procedure. This information is not intended to replace advice given to you by your health care provider. Make sure you discuss any questions you have with your health care provider. Document Released: 01/23/2013 Document Revised: 02/22/2018 Document Reviewed: 01/07/2016 Elsevier Patient Education  2020 [employer].

## 2018-08-07 NOTE — Progress Notes (Signed)
  HPI: Uterine Fibroids Patient is a 43 yo G3P3 s/p BTL presents with uterine fibroids. Periods are regular every 28-30 days, lasting several days. Dysmenorrhea:severe, occurring throughout menses. Cyclic symptoms include none. No intermenstrual bleeding, spotting, or discharge.  Pt mostly has pain and pressure in pelvis with positioning and difficulty sleeping at times, pressure on bladder with frequency, and feels the fibroids abdominally.  Ultrasound demonstrates 3 fibroids, see below These findings are Pelvis worse than prior studies  PMHx: She  has a past medical history of Allergy, Anemia, Anxiety, Hyperlipidemia, and Hypertension. Also,  has a past surgical history that includes Cesarean section., family history includes Cancer in her father; Diabetes in her father; Hypertension in her brother, mother, and sister.,  reports that she has never smoked. She has never used smokeless tobacco. She reports current alcohol use of about 2.0 standard drinks of alcohol per week. She reports that she does not use drugs.  She has a current medication list which includes the following prescription(s): atenolol, escitalopram, and valsartan-hydrochlorothiazide. Also, has No Known Allergies.  Review of Systems  All other systems reviewed and are negative.  Objective: BP 140/90   Ht 5\' 11"  (1.803 m)   Wt (!) 318 lb (144.2 kg)   LMP 08/01/2018   BMI 44.35 kg/m   Physical examination Constitutional NAD, Conversant  Skin No rashes, lesions or ulceration.   Extremities: Moves all appropriately.  Normal ROM for age. No lymphadenopathy.  Neuro: Grossly intact  Psych: Oriented to PPT.  Normal mood. Normal affect.   US Pelvic Complete With Transvaginal  Result Date: 08/07/2018 Patient Name: Cindy Maldonado DOB: [birth year] MRN: [id number] ULTRASOUND REPORT Location: [facility] Date of Service: 08/07/2018 Indications:Enlarged Uterus Findings: The uterus is anteverted and measures 14.4 x 11.6 x 9.7 cm.  Echo texture is heterogenous with evidence of focal masses. Within the uterus are multiple suspected fibroids measuring: Fibroid 1:80.1 x 76.1 x 78.1 mm- Partially submucosal Fibroid 2:31.0 x 36.4 x 32.4 mm-subserosal anterior fundal Fibroid 3: 31.4 x 26.4 x 28.9 mm-subserosal fundal The Endometrium is not visible due to a large fibroid in the middle of the myometrium. Right Ovary measures 2.9 x 1.6 x 2.0 cm cm. It is normal in appearance. Left Ovary measures 3.3 x 2.1 x 1.1 cm. It is normal in appearance. Survey of the adnexa demonstrates no adnexal masses. There is no free fluid in the cul de sac. Impression: 1. There are three uterine fibroids seen. The largest takes up most of the myometrium. 2. The endometrium is not visible due to the large fibroid. 3. Normal ovaries. Recommendations: 1.Clinical correlation with the patient's History and Physical Exam. Gweneth Dimitri, RT Review of ULTRASOUND.    I have personally reviewed images and report of recent ultrasound done at [facility].    Plan of management to be discussed with patient. 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

## 2018-08-10 ENCOUNTER — Telehealth: Payer: Self-pay | Admitting: Obstetrics & Gynecology

## 2018-08-10 NOTE — Telephone Encounter (Signed)
Lmtrc

## 2018-08-10 NOTE — Telephone Encounter (Signed)
-----   Message from Gae Dry, MD sent at 08/07/2018  8:59 AM EDT ----- Regarding: surgery Surgery Booking Request Patient Full Name:  Cindy Maldonado  MRN: [id number]  DOB: [birth year]  Surgeon: Hoyt Koch, MD  Requested Surgery Date and Time: SEPT 2020 Primary Diagnosis AND Code: Fibroids, Pain, Dysmenorrhea Secondary Diagnosis and Code:  Surgical Procedure: TAH, BS L&D Notification: No Admission Status: surgery admit Length of Surgery: 1.3 hr Special Case Needs: no H&P: yes (date) Phone Interview???: yes Interpreter: Language:  Medical Clearance: no Special Scheduling Instructions: no Acuity: P3

## 2018-10-19 ENCOUNTER — Telehealth: Payer: Self-pay | Admitting: Obstetrics & Gynecology

## 2018-10-19 NOTE — Telephone Encounter (Signed)
Contacted patient to follow-up on scheduling her surgery. Per patient, she still wants the surgery, but needs to schedule for late January or early February. Surgery scheduler to f/u with patient in late December, and patient to call back if no contact has been made by the end of the year.

## 2019-01-31 ENCOUNTER — Telehealth: Payer: Self-pay | Admitting: Obstetrics & Gynecology

## 2019-01-31 NOTE — Telephone Encounter (Signed)
Lmtrc

## 2019-03-01 NOTE — Telephone Encounter (Signed)
Lmtrc

## 2019-05-10 ENCOUNTER — Telehealth: Payer: Self-pay | Admitting: Obstetrics & Gynecology

## 2019-05-10 ENCOUNTER — Other Ambulatory Visit: Payer: Self-pay | Admitting: Obstetrics & Gynecology

## 2019-05-10 NOTE — Telephone Encounter (Signed)
-----   Message from Gae Dry, MD sent at 05/10/2019 10:30 AM EDT ----- Regarding: Sch appt Sch Annual in June.  Tell her to have MMG done prior to this appt if possible (or same day)

## 2019-05-10 NOTE — Telephone Encounter (Signed)
Called and left voicemail for patient to call back.

## 2019-07-30 ENCOUNTER — Other Ambulatory Visit: Payer: Self-pay | Admitting: Obstetrics & Gynecology

## 2019-07-30 DIAGNOSIS — Z1239 Encounter for other screening for malignant neoplasm of breast: Secondary | ICD-10-CM

## 2019-08-07 ENCOUNTER — Telehealth: Payer: Self-pay | Admitting: Obstetrics & Gynecology

## 2019-08-07 NOTE — Telephone Encounter (Signed)
-----   Message from Gae Dry, MD sent at 08/07/2019  7:28 AM EDT ----- Regarding: appt Sch annual plz ----- Message ----- From: SYSTEM Sent: 08/04/2019  12:09 AM EDT To: Gae Dry, MD

## 2019-08-07 NOTE — Telephone Encounter (Signed)
Called and left voicemail for patient to call back to be scheduled. 

## 2019-08-08 NOTE — Telephone Encounter (Signed)
Voicemail box is full - unable to leave message

## 2021-06-19 ENCOUNTER — Emergency Department
Admission: EM | Admit: 2021-06-19 | Discharge: 2021-06-19 | Disposition: A | Discharge status: Home / Self Care | Payer: BC Managed Care – PPO | Attending: Emergency Medicine | Admitting: Emergency Medicine

## 2021-06-19 ENCOUNTER — Other Ambulatory Visit: Payer: Self-pay

## 2021-06-19 ENCOUNTER — Encounter: Payer: Self-pay | Admitting: Emergency Medicine

## 2021-06-19 DIAGNOSIS — R002 Palpitations: Secondary | ICD-10-CM | POA: Insufficient documentation

## 2021-06-19 DIAGNOSIS — I10 Essential (primary) hypertension: Secondary | ICD-10-CM | POA: Diagnosis not present

## 2021-06-19 DIAGNOSIS — E039 Hypothyroidism, unspecified: Secondary | ICD-10-CM | POA: Diagnosis not present

## 2021-06-19 LAB — BASIC METABOLIC PANEL
Anion gap: 7 (ref 5–15)
BUN: 14 mg/dL (ref 6–20)
CO2: 27 mmol/L (ref 22–32)
Calcium: 9 mg/dL (ref 8.9–10.3)
Chloride: 104 mmol/L (ref 98–111)
Creatinine, Ser: 0.79 mg/dL (ref 0.44–1.00)
GFR, Estimated: >60 mL/min (ref >60)
Glucose, Bld: 106 mg/dL — ABNORMAL HIGH (ref 70–99)
Potassium: 3.1 mmol/L — ABNORMAL LOW (ref 3.5–5.1)
Sodium: 138 mmol/L (ref 135–145)

## 2021-06-19 LAB — CBC WITH DIFFERENTIAL/PLATELET
Abs Immature Granulocytes: 0.02 10*3/uL (ref 0.00–0.07)
Basophils Absolute: 0 10*3/uL (ref 0.0–0.1)
Basophils Relative: 1 %
Eosinophils Absolute: 0.2 10*3/uL (ref 0.0–0.5)
Eosinophils Relative: 3 %
HCT: 35.1 % — ABNORMAL LOW (ref 36.0–46.0)
Hemoglobin: 11.9 g/dL — ABNORMAL LOW (ref 12.0–15.0)
Immature Granulocytes: 0 %
Lymphocytes Relative: 35 %
Lymphs Abs: 1.8 10*3/uL (ref 0.7–4.0)
MCH: 27.6 pg (ref 26.0–34.0)
MCHC: 33.9 g/dL (ref 30.0–36.0)
MCV: 81.4 fL (ref 80.0–100.0)
Monocytes Absolute: 0.3 10*3/uL (ref 0.1–1.0)
Monocytes Relative: 6 %
Neutro Abs: 2.7 10*3/uL (ref 1.7–7.7)
Neutrophils Relative %: 55 %
Platelets: 251 10*3/uL (ref 150–400)
RBC: 4.31 MIL/uL (ref 3.87–5.11)
RDW: 12.7 % (ref 11.5–15.5)
WBC: 5 10*3/uL (ref 4.0–10.5)
nRBC: 0 % (ref 0.0–0.2)

## 2021-06-19 LAB — TSH: TSH: 7.036 u[IU]/mL — ABNORMAL HIGH (ref 0.350–4.500)

## 2021-06-19 LAB — MAGNESIUM: Magnesium: 2.1 mg/dL (ref 1.7–2.4)

## 2021-06-19 LAB — T4, FREE: Free T4: 0.74 ng/dL (ref 0.61–1.12)

## 2021-06-19 NOTE — ED Triage Notes (Signed)
Pt via POV from home. Pt c/o palpitations that started this AM around 0615, states she took 81 ASA and 1 metoprolol. Unknown the rate. Denies pain. Denies hx of irregular HR. Pt has a hx of anxiety and states last time it was her anxiety. Pt is A&Ox4 and NAD

## 2021-06-19 NOTE — Discharge Instructions (Signed)
Continue taking your medications as prescribed.  Your lab tests were all okay in the emergency department.  Please continue following up with your doctor for further evaluation of your symptoms and medication regimen.

## 2021-06-19 NOTE — ED Provider Notes (Signed)
[facility] Provider Note    Event Date/Time   First MD Initiated Contact with Patient 06/19/21 [phone]     (approximate)   History   Palpitations   HPI  Cindy Maldonado is a 46 y.o. female with a past history of hypertension, hypothyroidism, anxiety, anemia who comes to the ED today complaining of palpitations that started at about 6:15 AM with waking up this morning.  No chest pain, no shortness of breath or dizziness.  She took her daily metoprolol and baby aspirin, and feels better although symptoms are still persistent.  Nonradiating, no pleuritic or exertional symptoms.  She has had symptoms like this for a long time.  She reports seeing cardiology and doing a 30-day Holter monitor in the past which she reports was normal.     Physical Exam   Triage Vital Signs: ED Triage Vitals  Enc Vitals Group     BP 06/19/21 0712 (!) 160/106     Pulse Rate 06/19/21 0712 80     Resp 06/19/21 0712 20     Temp 06/19/21 0712 98.1 F (36.7 C)     Temp Source 06/19/21 0712 Oral     SpO2 06/19/21 0712 95 %     Weight 06/19/21 0709 (!) 315 lb (142.9 kg)     Height 06/19/21 0709 '5\' 11"'$  (1.803 m)     Head Circumference --      Peak Flow --      Pain Score 06/19/21 0709 0     Pain Loc --      Pain Edu? --      Excl. in [place]? --     Most recent vital signs: Vitals:   06/19/21 0712 06/19/21 0729  BP: (!) 160/106 (!) 148/81  Pulse: 80 62  Resp: 20 15  Temp: 98.1 F (36.7 C)   SpO2: 95% 99%     General: Awake, no distress.  CV:  Good peripheral perfusion.  Regular rate and rhythm, no murmurs Resp:  Normal effort.  Clear to auscultation bilaterally Abd:  No distention.  Soft nontender Other:  No lower extremity swelling or tenderness.   ED Results / Procedures / Treatments   Labs (all labs ordered are listed, but only abnormal results are displayed) Labs Reviewed  BASIC METABOLIC PANEL - Abnormal; Notable for the following components:      Result Value    Potassium 3.1 (*)    Glucose, Bld 106 (*)    All other components within normal limits  CBC WITH DIFFERENTIAL/PLATELET - Abnormal; Notable for the following components:   Hemoglobin 11.9 (*)    HCT 35.1 (*)    All other components within normal limits  TSH - Abnormal; Notable for the following components:   TSH 7.036 (*)    All other components within normal limits  MAGNESIUM  T4, FREE     EKG  Interpreted by me Normal sinus rhythm rate of 73, normal axis and intervals.  Normal QRS ST segments and T waves.  No evidence of underlying dysrhythmia   RADIOLOGY     PROCEDURES:  Critical Care performed: No  .1-3 Lead EKG Interpretation Performed by: Carrie Mew, MD Authorized by: Carrie Mew, MD     Interpretation: normal     ECG rate:  61   ECG rate assessment: normal     Rhythm: sinus rhythm     Ectopy: none     Conduction: normal     MEDICATIONS ORDERED IN ED: Medications -  No data to display   IMPRESSION / MDM / [place] / ED COURSE  I reviewed the triage vital signs and the nursing notes.                              Differential diagnosis includes, but is not limited to, electrolyte abnormality, hypothyroidism, anemia, symptomatic palpitations, anxiety  **The patient is on the cardiac monitor to evaluate for evidence of arrhythmia and/or significant heart rate changes.**}  Patient presents with palpitations.  Vital signs and exam are normal.  EKG normal.  Will check labs to look for signs of electrolyte disturbance, anemia, hyperthyroidism.  I suspect this is symptomatic palpitations/anxiety, and if reassuring work-up she can continue outpatient management.  ----------------------------------------- 8:43 AM on 06/19/2021 ----------------------------------------- Labs are all unremarkable.  Stable for discharge.     FINAL CLINICAL IMPRESSION(S) / ED DIAGNOSES   Final diagnoses:  Palpitations     Rx / DC Orders   ED  Discharge Orders     None        Note:  This document was prepared using Dragon voice recognition software and may include unintentional dictation errors.   Carrie Mew, MD 06/19/21 [phone]

## 2021-06-19 NOTE — ED Notes (Signed)
RN to bedside to introduce self to pt. Pt is CAOx4 and family at bedside.

## 2022-04-08 ENCOUNTER — Other Ambulatory Visit: Payer: Self-pay

## 2022-04-08 ENCOUNTER — Emergency Department
Admission: EM | Admit: 2022-04-08 | Discharge: 2022-04-09 | Disposition: A | Discharge status: Home / Self Care | Payer: BC Managed Care – PPO | Attending: Emergency Medicine | Admitting: Emergency Medicine

## 2022-04-08 ENCOUNTER — Emergency Department: Payer: BC Managed Care – PPO

## 2022-04-08 DIAGNOSIS — Z20822 Contact with and (suspected) exposure to covid-19: Secondary | ICD-10-CM | POA: Diagnosis not present

## 2022-04-08 DIAGNOSIS — R0602 Shortness of breath: Secondary | ICD-10-CM | POA: Diagnosis not present

## 2022-04-08 DIAGNOSIS — R079 Chest pain, unspecified: Secondary | ICD-10-CM | POA: Insufficient documentation

## 2022-04-08 DIAGNOSIS — E039 Hypothyroidism, unspecified: Secondary | ICD-10-CM | POA: Diagnosis not present

## 2022-04-08 DIAGNOSIS — R0609 Other forms of dyspnea: Secondary | ICD-10-CM | POA: Diagnosis not present

## 2022-04-08 DIAGNOSIS — R109 Unspecified abdominal pain: Secondary | ICD-10-CM | POA: Diagnosis present

## 2022-04-08 DIAGNOSIS — R1011 Right upper quadrant pain: Secondary | ICD-10-CM | POA: Insufficient documentation

## 2022-04-08 DIAGNOSIS — I10 Essential (primary) hypertension: Secondary | ICD-10-CM | POA: Diagnosis not present

## 2022-04-08 LAB — URINALYSIS, ROUTINE W REFLEX MICROSCOPIC
Bilirubin Urine: NEGATIVE
Glucose, UA: NEGATIVE mg/dL
Hgb urine dipstick: NEGATIVE
Ketones, ur: NEGATIVE mg/dL
Leukocytes,Ua: NEGATIVE
Nitrite: NEGATIVE
Protein, ur: NEGATIVE mg/dL
Specific Gravity, Urine: 1.01 (ref 1.005–1.030)
pH: 6 (ref 5.0–8.0)

## 2022-04-08 LAB — CBC
HCT: 37.6 % (ref 36.0–46.0)
Hemoglobin: 12.4 g/dL (ref 12.0–15.0)
MCH: 27.7 pg (ref 26.0–34.0)
MCHC: 33 g/dL (ref 30.0–36.0)
MCV: 84.1 fL (ref 80.0–100.0)
Platelets: 298 10*3/uL (ref 150–400)
RBC: 4.47 MIL/uL (ref 3.87–5.11)
RDW: 13.2 % (ref 11.5–15.5)
WBC: 8.8 10*3/uL (ref 4.0–10.5)
nRBC: 0 % (ref 0.0–0.2)

## 2022-04-08 LAB — COMPREHENSIVE METABOLIC PANEL
ALT: 22 U/L (ref 0–44)
AST: 18 U/L (ref 15–41)
Albumin: 3.9 g/dL (ref 3.5–5.0)
Alkaline Phosphatase: 98 U/L (ref 38–126)
Anion gap: 8 (ref 5–15)
BUN: 14 mg/dL (ref 6–20)
CO2: 28 mmol/L (ref 22–32)
Calcium: 9 mg/dL (ref 8.9–10.3)
Chloride: 100 mmol/L (ref 98–111)
Creatinine, Ser: 0.66 mg/dL (ref 0.44–1.00)
GFR, Estimated: >60 mL/min (ref >60)
Glucose, Bld: 101 mg/dL — ABNORMAL HIGH (ref 70–99)
Potassium: 3.5 mmol/L (ref 3.5–5.1)
Sodium: 136 mmol/L (ref 135–145)
Total Bilirubin: 0.6 mg/dL (ref 0.3–1.2)
Total Protein: 7.8 g/dL (ref 6.5–8.1)

## 2022-04-08 LAB — TROPONIN I (HIGH SENSITIVITY): Troponin I (High Sensitivity): 7 ng/L (ref <18)

## 2022-04-08 LAB — RESP PANEL BY RT-PCR (RSV, FLU A&B, COVID)  RVPGX2
Influenza A by PCR: NEGATIVE
Influenza B by PCR: NEGATIVE
Resp Syncytial Virus by PCR: NEGATIVE
SARS Coronavirus 2 by RT PCR: NEGATIVE

## 2022-04-08 LAB — LIPASE, BLOOD: Lipase: 35 U/L (ref 11–51)

## 2022-04-08 MED ORDER — IOHEXOL 350 MG/ML SOLN
75.0000 mL | Freq: Once | INTRAVENOUS | Status: AC | PRN
  Administered 2022-04-08: 75 mL via INTRAVENOUS

## 2022-04-08 MED ORDER — KETOROLAC TROMETHAMINE 30 MG/ML IJ SOLN
30.0000 mg | Freq: Once | INTRAMUSCULAR | Status: AC
  Administered 2022-04-09: 30 mg via INTRAVENOUS
  Filled 2022-04-08: qty 1

## 2022-04-08 NOTE — ED Notes (Signed)
ED Provider at bedside. 

## 2022-04-08 NOTE — ED Provider Notes (Signed)
[facility] Provider Note    Event Date/Time   First MD Initiated Contact with Patient 04/08/22 2334     (approximate)   History   Abdominal Pain and Shortness of Breath   HPI  Cindy Maldonado is a 47 y.o. female who presents to the ED for evaluation of Abdominal Pain and Shortness of Breath   I reviewed PCP visit from 1/5.  Morbidly obese patient with history of HTN, hypothyroidism, SVT, hysterectomy  Patient presents to the ED for evaluation of a few days of dyspnea on exertion and right-sided chest/upper abdominal discomfort.  She reports a bronchitis illness about a month ago that has resolved in the past 2 weeks, but since then she reports new dyspnea on exertion and right-sided discomfort with exertion and deep breaths and movement.  Reports pain to the RUQ abdomen and right flank but nothing substernal.  No fever, no emesis or syncope.  No decreased appetite, or) abdominal pain.  Toileting at baseline without diarrhea, dysuria or other changes.   Physical Exam   Triage Vital Signs: ED Triage Vitals  Enc Vitals Group     BP 04/08/22 1747 (!) 142/99     Pulse Rate 04/08/22 1747 66     Resp 04/08/22 1747 18     Temp 04/08/22 1747 98 F (36.7 C)     Temp Source 04/08/22 1747 Oral     SpO2 04/08/22 1747 99 %     Weight --      Height --      Head Circumference --      Peak Flow --      Pain Score 04/08/22 1748 9     Pain Loc --      Pain Edu? --      Excl. in [place]? --     Most recent vital signs: Vitals:   04/08/22 1747 04/08/22 2117  BP: (!) 142/99 (!) 148/98  Pulse: 66 60  Resp: 18 18  Temp: 98 F (36.7 C)   SpO2: 99% 100%    General: Awake, no distress.  CV:  Good peripheral perfusion.  Resp:  Normal effort.  Abd:  No distention.  Poorly localizing right-sided abdominal tenderness that is most prominent to the upper abdomen.  Lower abdomen is benign. MSK:  No deformity noted.  Neuro:  No focal deficits  appreciated. Other:     ED Results / Procedures / Treatments   Labs (all labs ordered are listed, but only abnormal results are displayed) Labs Reviewed  COMPREHENSIVE METABOLIC PANEL - Abnormal; Notable for the following components:      Result Value   Glucose, Bld 101 (*)    All other components within normal limits  URINALYSIS, ROUTINE W REFLEX MICROSCOPIC - Abnormal; Notable for the following components:   Color, Urine STRAW (*)    APPearance CLEAR (*)    All other components within normal limits  RESP PANEL BY RT-PCR (RSV, FLU A&B, COVID)  RVPGX2  LIPASE, BLOOD  CBC  TROPONIN I (HIGH SENSITIVITY)  TROPONIN I (HIGH SENSITIVITY)    EKG Sinus rhythm with a rate of 64 bpm.  Normal axis and intervals.  Nonspecific ST changes laterally and inferiorly without clear signs of acute ischemia. Comparison from May of last year is very similar  RADIOLOGY CXR interpreted by me without evidence of acute cardiopulmonary pathology.  Official radiology report(s): CT Angio Chest PE W and/or Wo Contrast  Result Date: 04/09/2022 CLINICAL DATA:  Dyspnea on exertion  with right-sided chest pain EXAM: CT ANGIOGRAPHY CHEST WITH CONTRAST TECHNIQUE: Multidetector CT imaging of the chest was performed using the standard protocol during bolus administration of intravenous contrast. Multiplanar CT image reconstructions and MIPs were obtained to evaluate the vascular anatomy. RADIATION DOSE REDUCTION: This exam was performed according to the departmental dose-optimization program which includes automated exposure control, adjustment of the mA and/or kV according to patient size and/or use of iterative reconstruction technique. CONTRAST:  63m OMNIPAQUE IOHEXOL 350 MG/ML SOLN COMPARISON:  Chest x-ray from the previous day. FINDINGS: Cardiovascular: Thoracic aorta shows no significant atherosclerotic calcifications. No aneurysmal dilatation is noted. The pulmonary artery shows a normal branching pattern  bilaterally. No filling defect to suggest pulmonary embolism is noted. Mediastinum/Nodes: Thoracic inlet is within normal limits. No hilar or mediastinal adenopathy is noted. The esophagus as visualized is within normal limits. Lungs/Pleura: Lungs are well aerated bilaterally. No focal infiltrate or sizable effusion is seen. Upper Abdomen: Visualized upper abdomen is unremarkable. Musculoskeletal: No chest wall abnormality. No acute or significant osseous findings. Review of the MIP images confirms the above findings. IMPRESSION: No evidence of pulmonary emboli. No acute abnormality seen. Electronically Signed   By: MInez CatalinaM.D.   On: 04/09/2022 00:15   DG Chest 2 View  Result Date: 04/08/2022 CLINICAL DATA:  Shortness of breath EXAM: CHEST - 2 VIEW COMPARISON:  01/13/2011 x-ray FINDINGS: No consolidation, pneumothorax or effusion. No edema. Normal cardiopericardial silhouette without edema. Degenerative changes are seen of the spine on lateral view. IMPRESSION: No acute cardiopulmonary disease Electronically Signed   By: AJill SideM.D.   On: 04/08/2022 18:40    PROCEDURES and INTERVENTIONS:  .1-3 Lead EKG Interpretation  Performed by: SVladimir Crofts MD Authorized by: SVladimir Crofts MD     Interpretation: normal     ECG rate:  66   ECG rate assessment: normal     Rhythm: sinus rhythm     Ectopy: none     Conduction: normal     Medications  ketorolac (TORADOL) 30 MG/ML injection 30 mg (30 mg Intravenous Given 04/09/22 0003)  iohexol (OMNIPAQUE) 350 MG/ML injection 75 mL (75 mLs Intravenous Contrast Given 04/08/22 2356)     IMPRESSION / MDM / ASSESSMENT AND PLAN / ED COURSE  I reviewed the triage vital signs and the nursing notes.  Differential diagnosis includes, but is not limited to, ACS, PTX, PNA, muscle strain/spasm, PE, dissection, appendicitis  {Patient presents with symptoms of an acute illness or injury that is potentially life-threatening.  47year old obese woman presents  with dyspnea on exertion and right-sided discomfort since her recent viral URI.  She looks well to me and has a reassuring workup.  Normal CBC, LFTs and lipase.  Reassuring urinalysis and first troponin.  Her exam and presenting history is more concerning for cardiopulmonary pathology such as a PE or pneumonia.  Possibly MSK pain.  I doubt appendicitis and discussed this with her and she is agreeable to only pursue chest cross-sectional imaging.   Reassuring CTA chest and second troponin.  I considered observation admission for this patient, but ultimately we decided upon outpatient management with close follow-up.  Clinical Course as of 04/09/22 0114  Fri Apr 09, 2022  0112 Reassessed.  Feeling better after the Toradol.  We discussed following up with a cardiologist and appropriate return precautions for the ED.  She is appreciative and expresses understanding and agreement [DS]    Clinical Course User Index [DS] SVladimir Crofts MD  FINAL CLINICAL IMPRESSION(S) / ED DIAGNOSES   Final diagnoses:  DOE (dyspnea on exertion)  Acute right flank pain     Rx / DC Orders   ED Discharge Orders     None        Note:  This document was prepared using Dragon voice recognition software and may include unintentional dictation errors.   Vladimir Crofts, MD 04/09/22 [phone]

## 2022-04-08 NOTE — ED Triage Notes (Signed)
Pt to ED via POV from [facility]. Pt reports after she started taking prednisone she started having sharp abdominal pain (RLQ) that radiates up and into her back x4 days.  Pt also reports cough and rib pain x1 month.

## 2022-04-09 LAB — TROPONIN I (HIGH SENSITIVITY): Troponin I (High Sensitivity): 5 ng/L (ref <18)

## 2022-07-29 DIAGNOSIS — R002 Palpitations: Secondary | ICD-10-CM | POA: Diagnosis not present

## 2022-07-29 DIAGNOSIS — I10 Essential (primary) hypertension: Secondary | ICD-10-CM | POA: Diagnosis not present

## 2022-08-04 DIAGNOSIS — R42 Dizziness and giddiness: Secondary | ICD-10-CM | POA: Diagnosis not present

## 2022-08-04 DIAGNOSIS — R519 Headache, unspecified: Secondary | ICD-10-CM | POA: Diagnosis not present

## 2022-08-04 DIAGNOSIS — Z79899 Other long term (current) drug therapy: Secondary | ICD-10-CM | POA: Diagnosis not present

## 2022-08-04 DIAGNOSIS — I10 Essential (primary) hypertension: Secondary | ICD-10-CM | POA: Diagnosis not present

## 2022-08-05 DIAGNOSIS — R030 Elevated blood-pressure reading, without diagnosis of hypertension: Secondary | ICD-10-CM | POA: Diagnosis not present

## 2022-08-18 DIAGNOSIS — R9431 Abnormal electrocardiogram [ECG] [EKG]: Secondary | ICD-10-CM | POA: Diagnosis not present

## 2022-08-18 DIAGNOSIS — Z6841 Body Mass Index (BMI) 40.0 and over, adult: Secondary | ICD-10-CM | POA: Diagnosis not present

## 2022-08-18 DIAGNOSIS — I471 Supraventricular tachycardia, unspecified: Secondary | ICD-10-CM | POA: Diagnosis not present

## 2022-08-18 DIAGNOSIS — E039 Hypothyroidism, unspecified: Secondary | ICD-10-CM | POA: Diagnosis not present

## 2022-08-18 DIAGNOSIS — I77810 Thoracic aortic ectasia: Secondary | ICD-10-CM | POA: Diagnosis not present

## 2022-08-18 DIAGNOSIS — I10 Essential (primary) hypertension: Secondary | ICD-10-CM | POA: Diagnosis not present

## 2022-09-10 DIAGNOSIS — Z6841 Body Mass Index (BMI) 40.0 and over, adult: Secondary | ICD-10-CM | POA: Diagnosis not present

## 2022-09-10 DIAGNOSIS — I10 Essential (primary) hypertension: Secondary | ICD-10-CM | POA: Diagnosis not present

## 2022-09-24 DIAGNOSIS — I471 Supraventricular tachycardia, unspecified: Secondary | ICD-10-CM | POA: Diagnosis not present

## 2022-11-18 DIAGNOSIS — R7303 Prediabetes: Secondary | ICD-10-CM | POA: Diagnosis not present

## 2022-11-18 DIAGNOSIS — Z6841 Body Mass Index (BMI) 40.0 and over, adult: Secondary | ICD-10-CM | POA: Diagnosis not present

## 2022-11-18 DIAGNOSIS — E039 Hypothyroidism, unspecified: Secondary | ICD-10-CM | POA: Diagnosis not present

## 2022-11-18 DIAGNOSIS — Z5181 Encounter for therapeutic drug level monitoring: Secondary | ICD-10-CM | POA: Diagnosis not present

## 2022-11-18 DIAGNOSIS — Z136 Encounter for screening for cardiovascular disorders: Secondary | ICD-10-CM | POA: Diagnosis not present
